# Patient Record
Sex: Female | Born: 1955 | Race: White | Hispanic: No | Marital: Single | Smoking: Current some day smoker
Health system: Southern US, Community
[De-identification: ages and names within clinical notes are randomized; demographics above are authoritative.]

## PROBLEM LIST (undated history)

## (undated) DIAGNOSIS — E079 Disorder of thyroid, unspecified: Secondary | ICD-10-CM

## (undated) DIAGNOSIS — J45909 Unspecified asthma, uncomplicated: Secondary | ICD-10-CM

## (undated) DIAGNOSIS — N390 Urinary tract infection, site not specified: Secondary | ICD-10-CM

## (undated) HISTORY — PX: ABDOMINAL HYSTERECTOMY: SHX81

## (undated) HISTORY — PX: NECK SURGERY: SHX720

## (undated) HISTORY — PX: OTHER SURGICAL HISTORY: SHX169

## (undated) HISTORY — PX: TONSILLECTOMY: SUR1361

## (undated) HISTORY — PX: BACK SURGERY: SHX140

---

## 2015-12-12 ENCOUNTER — Emergency Department (HOSPITAL_COMMUNITY): Payer: Medicare Other

## 2015-12-12 ENCOUNTER — Emergency Department (HOSPITAL_COMMUNITY)
Admission: EM | Admit: 2015-12-12 | Discharge: 2015-12-13 | Disposition: A | Discharge status: Home / Self Care | Payer: Medicare Other | Attending: Emergency Medicine | Admitting: Emergency Medicine

## 2015-12-12 ENCOUNTER — Encounter (HOSPITAL_COMMUNITY): Payer: Self-pay | Admitting: Emergency Medicine

## 2015-12-12 DIAGNOSIS — J45909 Unspecified asthma, uncomplicated: Secondary | ICD-10-CM | POA: Insufficient documentation

## 2015-12-12 DIAGNOSIS — F17200 Nicotine dependence, unspecified, uncomplicated: Secondary | ICD-10-CM | POA: Insufficient documentation

## 2015-12-12 DIAGNOSIS — Z79899 Other long term (current) drug therapy: Secondary | ICD-10-CM | POA: Diagnosis not present

## 2015-12-12 DIAGNOSIS — N390 Urinary tract infection, site not specified: Secondary | ICD-10-CM | POA: Diagnosis not present

## 2015-12-12 DIAGNOSIS — R1030 Lower abdominal pain, unspecified: Secondary | ICD-10-CM | POA: Diagnosis present

## 2015-12-12 HISTORY — DX: Urinary tract infection, site not specified: N39.0

## 2015-12-12 HISTORY — DX: Disorder of thyroid, unspecified: E07.9

## 2015-12-12 HISTORY — DX: Unspecified asthma, uncomplicated: J45.909

## 2015-12-12 LAB — BASIC METABOLIC PANEL
ANION GAP: 8 (ref 5–15)
BUN: 12 mg/dL (ref 6–20)
CHLORIDE: 103 mmol/L (ref 101–111)
CO2: 26 mmol/L (ref 22–32)
Calcium: 9.1 mg/dL (ref 8.9–10.3)
Creatinine, Ser: 0.58 mg/dL (ref 0.44–1.00)
Glucose, Bld: 103 mg/dL — ABNORMAL HIGH (ref 65–99)
POTASSIUM: 4.2 mmol/L (ref 3.5–5.1)
SODIUM: 137 mmol/L (ref 135–145)

## 2015-12-12 LAB — CBC
HEMATOCRIT: 40.2 % (ref 36.0–46.0)
Hemoglobin: 13.2 g/dL (ref 12.0–15.0)
MCH: 30.6 pg (ref 26.0–34.0)
MCHC: 32.8 g/dL (ref 30.0–36.0)
MCV: 93.1 fL (ref 78.0–100.0)
Platelets: 352 10*3/uL (ref 150–400)
RBC: 4.32 MIL/uL (ref 3.87–5.11)
RDW: 13.7 % (ref 11.5–15.5)
WBC: 7 10*3/uL (ref 4.0–10.5)

## 2015-12-12 LAB — URINE MICROSCOPIC-ADD ON

## 2015-12-12 LAB — URINALYSIS, ROUTINE W REFLEX MICROSCOPIC
Bilirubin Urine: NEGATIVE
Glucose, UA: NEGATIVE mg/dL
Hgb urine dipstick: NEGATIVE
Ketones, ur: NEGATIVE mg/dL
LEUKOCYTES UA: NEGATIVE
NITRITE: POSITIVE — AB
PROTEIN: NEGATIVE mg/dL
SPECIFIC GRAVITY, URINE: 1.017 (ref 1.005–1.030)
pH: 5.5 (ref 5.0–8.0)

## 2015-12-12 MED ORDER — MORPHINE SULFATE (PF) 4 MG/ML IV SOLN
4.0000 mg | Freq: Once | INTRAVENOUS | Status: AC
  Administered 2015-12-12: 4 mg via INTRAVENOUS
  Filled 2015-12-12: qty 1

## 2015-12-12 MED ORDER — ONDANSETRON 4 MG PO TBDP
4.0000 mg | ORAL_TABLET | Freq: Once | ORAL | Status: AC | PRN
  Administered 2015-12-12: 4 mg via ORAL
  Filled 2015-12-12: qty 1

## 2015-12-12 MED ORDER — DEXTROSE 5 % IV SOLN
1.0000 g | Freq: Once | INTRAVENOUS | Status: AC
  Administered 2015-12-12: 1 g via INTRAVENOUS
  Filled 2015-12-12: qty 10

## 2015-12-12 MED ORDER — SODIUM CHLORIDE 0.9 % IV BOLUS (SEPSIS)
500.0000 mL | Freq: Once | INTRAVENOUS | Status: AC
  Administered 2015-12-12: 500 mL via INTRAVENOUS

## 2015-12-12 MED ORDER — SULFAMETHOXAZOLE-TRIMETHOPRIM 800-160 MG PO TABS: 1.0000 | ORAL_TABLET | Freq: Two times a day (BID) | ORAL | 0 refills | Status: AC

## 2015-12-12 NOTE — Discharge Instructions (Signed)
Take antibiotics as prescribed. Encourage adequate hydration, drink plenty of fluids. Follow up with your primary care provider for re-evaluation. Return to the ED if you experience fevers, chills, vomiting, blood in urine.

## 2015-12-12 NOTE — ED Triage Notes (Addendum)
Pt complaint of positive UTI with E coli diagnosed via primary care physician. Pt currently taking nitrofurantion starting 7/26 without relief. Pt reports last percocet 2 hours ago.

## 2015-12-12 NOTE — ED Provider Notes (Signed)
[facility]-EMERGENCY DEPT Provider Note   CSN: [id number] Arrival date & time: 12/12/15  1717  First Provider Contact:  None       History   Chief Complaint Chief Complaint  Patient presents with  . Urinary Tract Infection  . Abnormal Lab    HPI Felicia Haynes is a 60 y.o. female with no significant past medical history who presents to the ED today complaining of dysuria and right-sided flank pain. Patient states that one week ago she began having symptoms of bladder pressure, hematuria and right-sided flank pain. She was seen by her PCP and was diagnosed with UTI from Escherichia coli. Patient was started on nitrofurantoin which she has been taking daily. She states that her pain is continuing and she has not had any relief of the antibiotics. She's been taking home Percocet which she is prescribed for chronic back pain without any relief of her pain. She denies any nausea, vomiting, fevers or chills.  HPI  Past Medical History:  Diagnosis Date  . Asthma   . Thyroid disease   . UTI (lower urinary tract infection)     There are no active problems to display for this patient.   Past Surgical History:  Procedure Laterality Date  . ABDOMINAL HYSTERECTOMY    . BACK SURGERY    . NECK SURGERY    . rotator cuff surgery    . TONSILLECTOMY      OB History    No data available       Home Medications    Prior to Admission medications   Medication Sig Start Date End Date Taking? Authorizing Provider  albuterol (PROVENTIL HFA;VENTOLIN HFA) 108 (90 Base) MCG/ACT inhaler Inhale 2 puffs into the lungs every 6 (six) hours as needed for wheezing or shortness of breath.   Yes Historical Provider, MD  alprazolam Prudy Feeler) 2 MG tablet Take 2 mg by mouth 3 (three) times daily as needed for sleep.  03/17/12  Yes Historical Provider, MD  estrogens, conjugated, (PREMARIN) 0.625 MG tablet TAKE (1) TABLET BY MOUTH DAILY (EVERY 24 HOURS). 11/07/15  Yes Historical Provider, MD  fluticasone  furoate-vilanterol (BREO ELLIPTA) 100-25 MCG/INH AEPB Inhale 1 puff into the lungs daily.  11/07/15 11/06/16 Yes Historical Provider, MD  levothyroxine (SYNTHROID, LEVOTHROID) 75 MCG tablet TAKE 1 TABLET BY MOUTH DAILY 06/30/15 06/29/16 Yes Historical Provider, MD  lidocaine (XYLOCAINE) 5 % ointment APPLY 2 GRAMS TOPICALLY TO THE AFFECTED AREA 2 TIMES PER DAY 11/28/15  Yes Historical Provider, MD  nitrofurantoin, macrocrystal-monohydrate, (MACROBID) 100 MG capsule Take 100 mg by mouth 2 (two) times daily. ABT Start Date 12/07/15 & End Date 12/15/15. 12/06/15 12/13/15 Yes Historical Provider, MD  Omega-3 Fatty Acids (FISH OIL) 1000 MG CAPS Take 1 capsule by mouth daily.    Yes Historical Provider, MD  oxyCODONE-acetaminophen (PERCOCET) 7.5-325 MG tablet Take 1 tablet by mouth every 6 (six) hours as needed for moderate pain.    Yes Historical Provider, MD  QUEtiapine (SEROQUEL) 300 MG tablet Take 300 mg by mouth at bedtime.  12/10/13  Yes Historical Provider, MD  valACYclovir (VALTREX) 1000 MG tablet Take 500 mg by mouth daily as needed (fever blister).  11/07/15  Yes Historical Provider, MD  Vitamin D, Ergocalciferol, (DRISDOL) 50000 units CAPS capsule Take 50,000 Units by mouth every 7 (seven) days.  11/07/15 11/06/16 Yes Historical Provider, MD    Family History No family history on file.  Social History Social History  Substance Use Topics  . Smoking status: Current Some Day  Smoker  . Smokeless tobacco: Not on file  . Alcohol use No     Allergies   Review of patient's allergies indicates no known allergies.   Review of Systems Review of Systems  All other systems reviewed and are negative.    Physical Exam Updated Vital Signs BP 151/87 (BP Location: Right Arm)   Pulse 98   Temp 98.2 F (36.8 C) (Oral)   Resp 18   LMP  (LMP Unknown)   SpO2 98%   Physical Exam  Constitutional: She is oriented to person, place, and time. She appears well-developed and well-nourished. No distress.  HENT:    Head: Normocephalic and atraumatic.  Mouth/Throat: No oropharyngeal exudate.  Eyes: Conjunctivae and EOM are normal. Pupils are equal, round, and reactive to light. Right eye exhibits no discharge. Left eye exhibits no discharge. No scleral icterus.  Cardiovascular: Normal rate, regular rhythm, normal heart sounds and intact distal pulses.  Exam reveals no gallop and no friction rub.   No murmur heard. Pulmonary/Chest: Effort normal and breath sounds normal. No respiratory distress. She has no wheezes. She has no rales. She exhibits no tenderness.  Abdominal: Soft. She exhibits no distension. There is tenderness ( suprapubic). There is no guarding.  Right CVA tenderness   Musculoskeletal: Normal range of motion. She exhibits no edema.  Neurological: She is alert and oriented to person, place, and time.  Skin: Skin is warm and dry. No rash noted. She is not diaphoretic. No erythema. No pallor.  Psychiatric: She has a normal mood and affect. Her behavior is normal.  Nursing note and vitals reviewed.    ED Treatments / Results  Labs (all labs ordered are listed, but only abnormal results are displayed) Labs Reviewed  URINALYSIS, ROUTINE W REFLEX MICROSCOPIC (NOT AT [facility]) - Abnormal; Notable for the following:       Result Value   Color, Urine AMBER (*)    APPearance CLOUDY (*)    Nitrite POSITIVE (*)    All other components within normal limits  BASIC METABOLIC PANEL - Abnormal; Notable for the following:    Glucose, Bld 103 (*)    All other components within normal limits  URINE MICROSCOPIC-ADD ON - Abnormal; Notable for the following:    Squamous Epithelial / LPF 6-30 (*)    Bacteria, UA RARE (*)    All other components within normal limits  URINE CULTURE  CBC    EKG  EKG Interpretation None       Radiology Ct Renal Stone Study  Result Date: 12/12/2015 CLINICAL DATA:  RIGHT flank pain, no history of kidney stones. Concurrent urinary tract infection, on antibiotics  without symptomatic improvement. EXAM: CT ABDOMEN AND PELVIS WITHOUT CONTRAST TECHNIQUE: Multidetector CT imaging of the abdomen and pelvis was performed following the standard protocol without IV contrast. COMPARISON:  None. FINDINGS: LUNG BASES: Included view of the lung bases are clear. The visualized heart and pericardium are unremarkable. KIDNEYS/BLADDER: Kidneys are orthotopic, demonstrating normal size and morphology. No nephrolithiasis, hydronephrosis; limited assessment for renal masses on this nonenhanced examination. Mild pelviectasis without frank hydronephrosis. The unopacified ureters are normal in course and caliber. Urinary bladder is decompressed and unremarkable. SOLID ORGANS: The spleen, gallbladder, and adrenal glands are unremarkable for this non-contrast examination. Punctate calcified splenic granulomas. Fatty atrophy of the pancreas. GASTROINTESTINAL TRACT: The stomach, small and large bowel are normal in course and caliber without inflammatory changes, the sensitivity may be decreased by lack of enteric contrast. Mild colonic diverticulosis. Normal appendix.  PERITONEUM/RETROPERITONEUM: Aortoiliac vessels are normal in course and caliber, mild calcific atherosclerosis. No lymphadenopathy by CT size criteria. Status post hysterectomy. No intraperitoneal free fluid nor free air. SOFT TISSUES/ OSSEOUS STRUCTURES: Nonsuspicious. Status post L5-S1 PLIF, solid interbody fusion. IMPRESSION: No urolithiasis or obstructive uropathy. No acute intra-abdominal/pelvic process by noncontrast CT. Electronically Signed   By: Awilda Metro M.D.   On: 12/12/2015 22:21    Procedures Procedures (including critical care time)  Medications Ordered in ED Medications  ondansetron (ZOFRAN-ODT) disintegrating tablet 4 mg (4 mg Oral Given 12/12/15 1817)  sodium chloride 0.9 % bolus 500 mL (500 mLs Intravenous New Bag/Given 12/12/15 2237)  cefTRIAXone (ROCEPHIN) 1 g in dextrose 5 % 50 mL IVPB (1 g  Intravenous New Bag/Given 12/12/15 2228)  morphine 4 MG/ML injection 4 mg (4 mg Intravenous Given 12/12/15 2227)     Initial Impression / Assessment and Plan / ED Course  I have reviewed the triage vital signs and the nursing notes.  Pertinent labs & imaging results that were available during my care of the patient were reviewed by me and considered in my medical decision making (see chart for details).  Clinical Course    60 y.o F presents to the ED c/o ongoing bladder press and R flank pain anf ter being diagnosed with a UTI 1 week ago by PCP. Pt has been taking nitrofurantoin without relief. UA is Nitrite positive and appears infected. HGB in urine. Pt otherwise appears well, non-toxic and non-septic appearing. All VSS. Pt given IV rocephin, fluids and pain medication. CT renal stone study obtained which was unremarkable. No leukocytosis. Renal function is wnl. Feel that pt can be discharge home with rx change to Bactrim. Follow up with PCP next week.  Final Clinical Impressions(s) / ED Diagnoses   Final diagnoses:  UTI (lower urinary tract infection)    New Prescriptions New Prescriptions   No medications on file     Dub Mikes, PA-C 12/13/15 1343    Maia Plan, MD 12/13/15 1504

## 2015-12-14 LAB — URINE CULTURE: Culture: NO GROWTH

## 2016-04-17 ENCOUNTER — Ambulatory Visit: Payer: Medicare Other | Admitting: Internal Medicine

## 2016-06-15 ENCOUNTER — Ambulatory Visit: Payer: Medicare Other | Admitting: Internal Medicine

## 2017-01-29 IMAGING — CT CT RENAL STONE PROTOCOL
2 of 3 series · 16 of 46 positions shown, 18 images · non-contrast
Comparison: None.

CLINICAL DATA: RIGHT flank pain, no history of kidney stones.
Concurrent urinary tract infection, on antibiotics without
symptomatic improvement.

EXAM:
CT ABDOMEN AND PELVIS WITHOUT CONTRAST
TECHNIQUE: Multidetector CT imaging of the abdomen and pelvis was performed
following the standard protocol without IV contrast.

[Series 4: lung · axial · 0.71mm/px · z∈[-118,-38]mm · 13 of 48 slices shown, 15 images]
[im 4/48  soft-tissue]
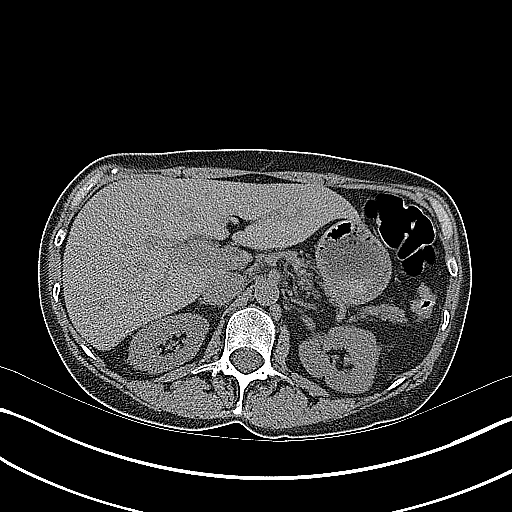
[im 4/48  bone]
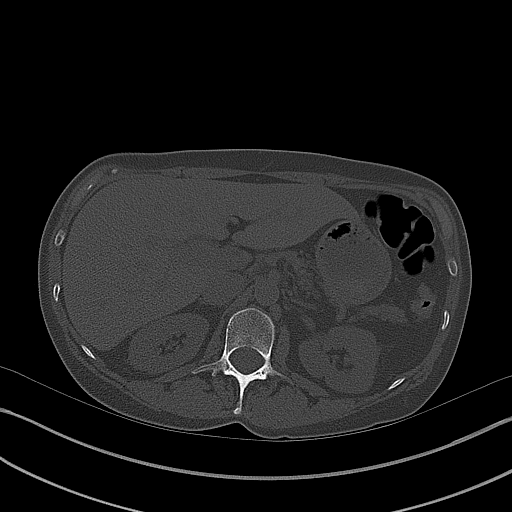
[im 7/48  soft-tissue]
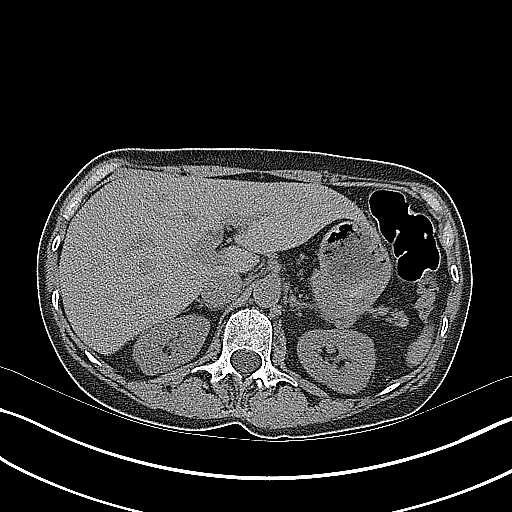
[im 10/48  soft-tissue]
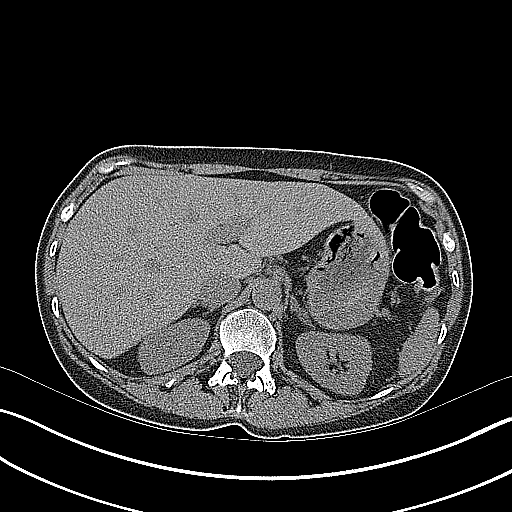
[im 14/48  soft-tissue]
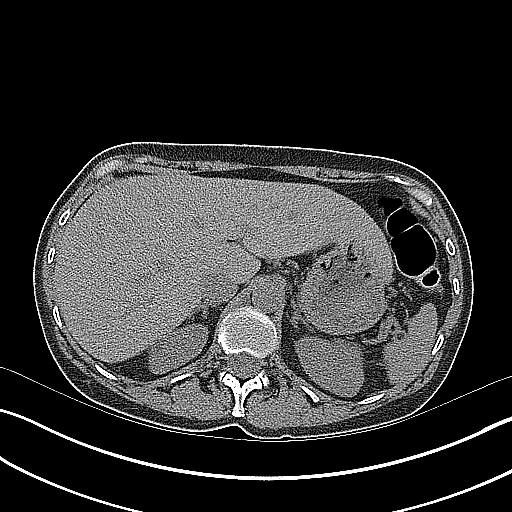
[im 17/48  soft-tissue]
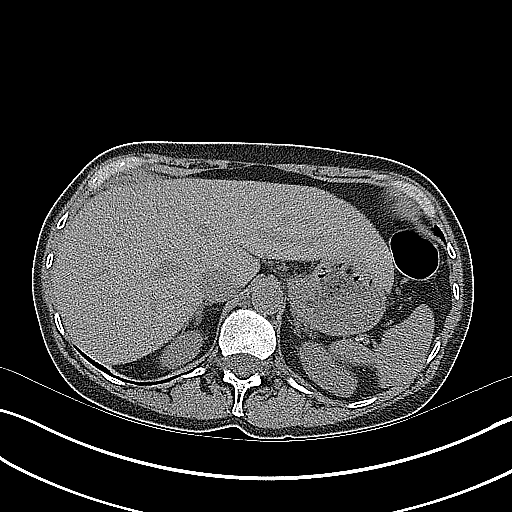
[im 20/48  soft-tissue]
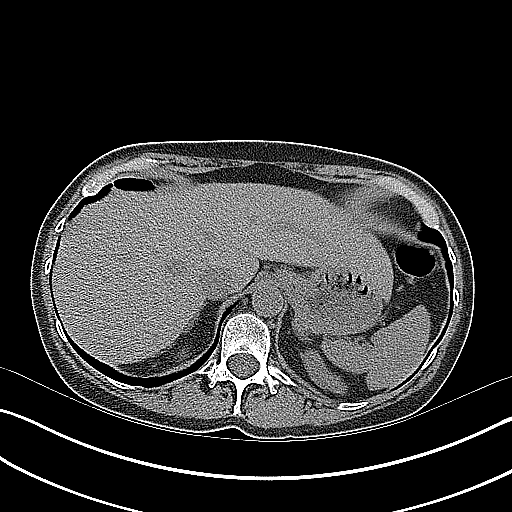
[im 25/48  soft-tissue]
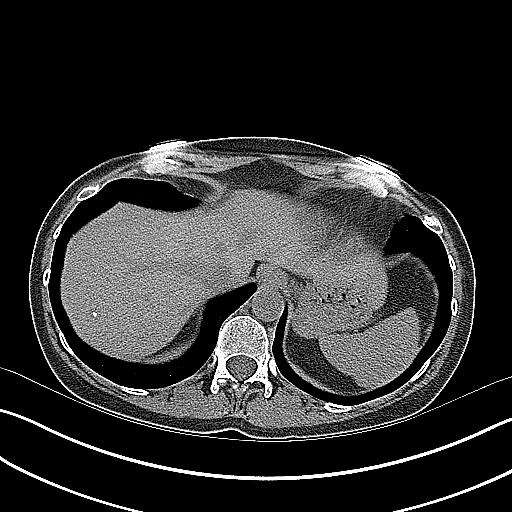
[im 28/48  soft-tissue]
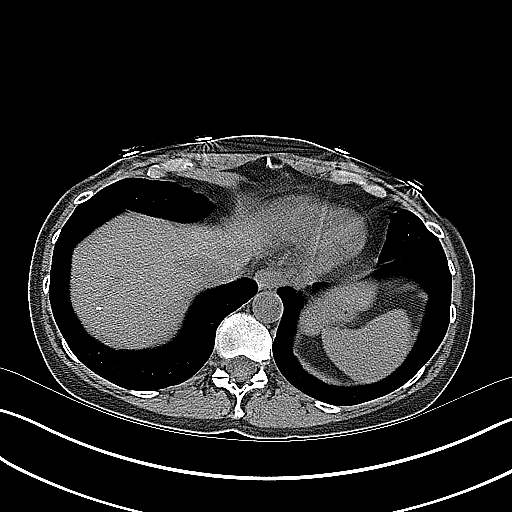
[im 31/48  soft-tissue]
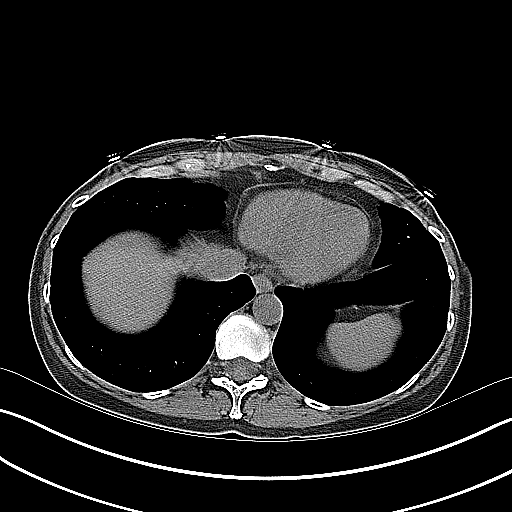
[im 31/48  bone]
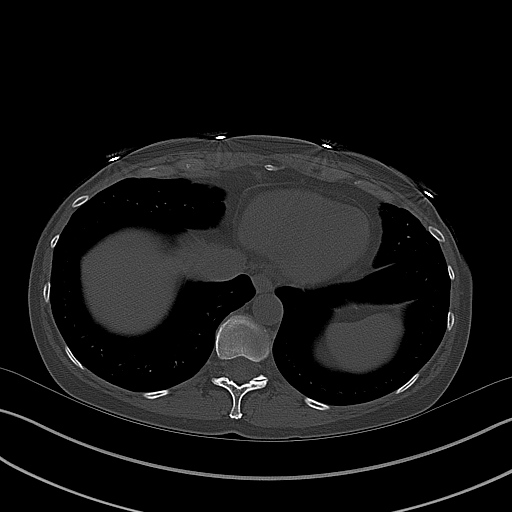
[im 34/48  soft-tissue]
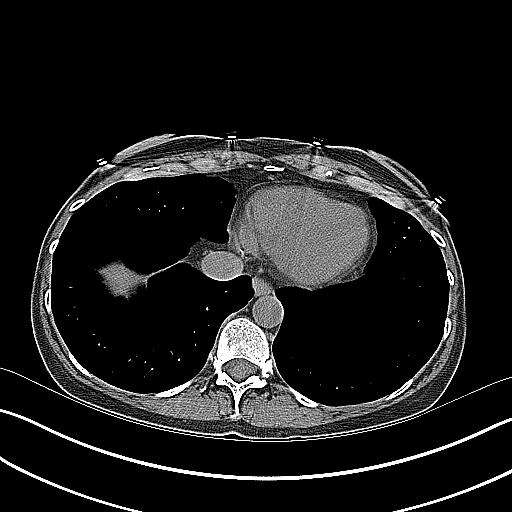
[im 38/48  soft-tissue]
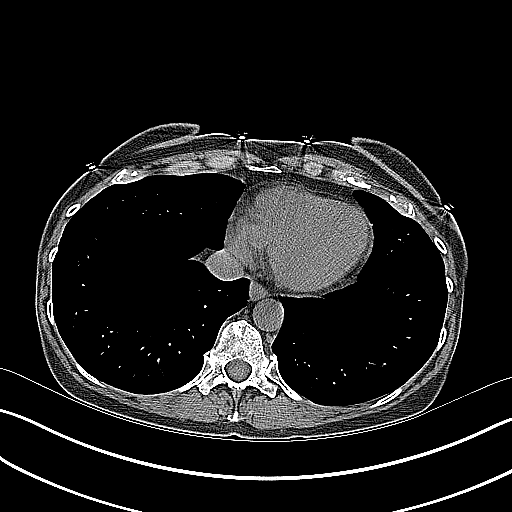
[im 41/48  soft-tissue]
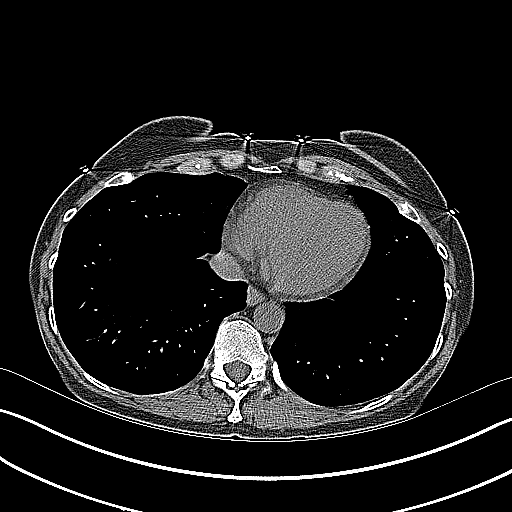
[im 44/48  soft-tissue]
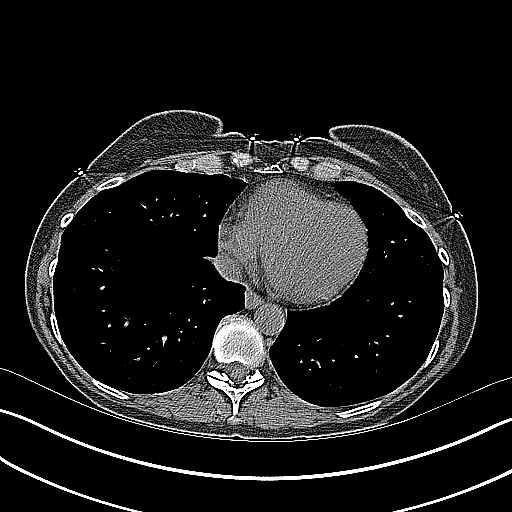

[Series 5: coronal · coronal · 0.77mm/px · 3 of 134 slices shown]
[im 45/134  soft-tissue]
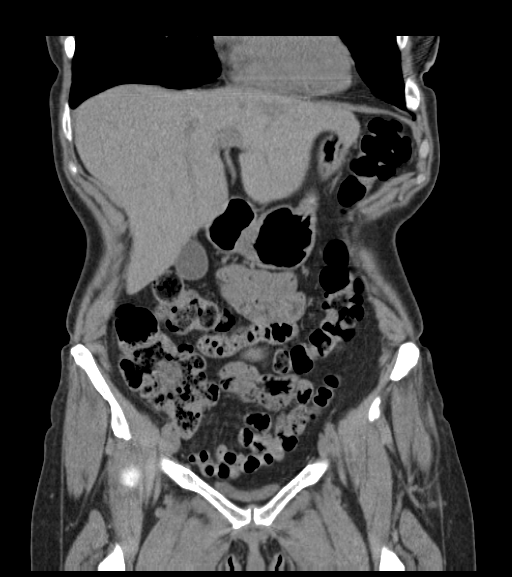
[im 60/134  soft-tissue]
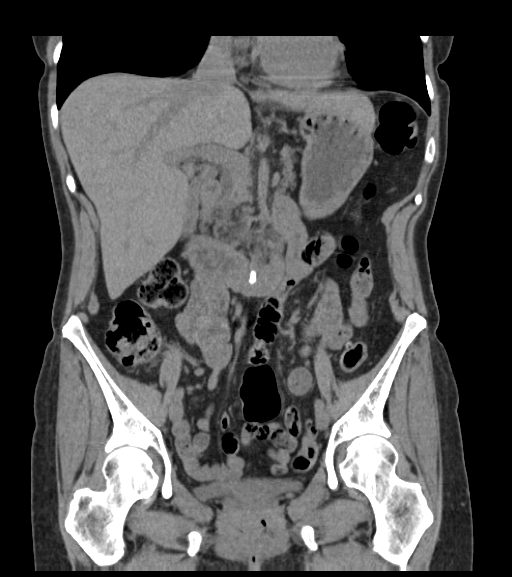
[im 74/134  soft-tissue]
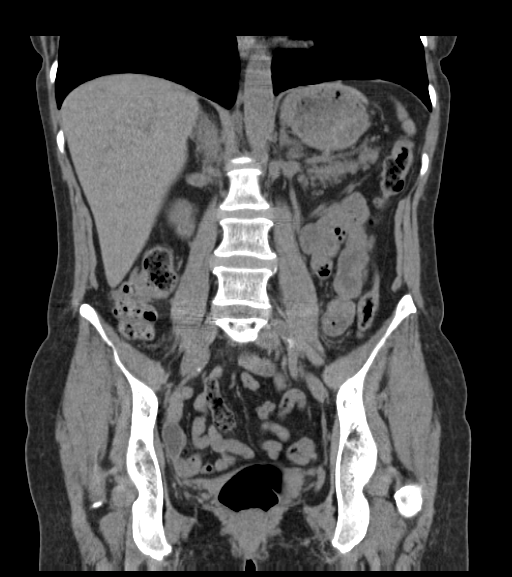

[16 of 46 positions shown; findings below may reference images not displayed]

FINDINGS: LUNG BASES: Included view of the lung bases are clear. The
visualized heart and pericardium are unremarkable.

KIDNEYS/BLADDER: Kidneys are orthotopic, demonstrating normal size
and morphology. No nephrolithiasis, hydronephrosis; limited
assessment for renal masses on this nonenhanced examination. Mild
pelviectasis without frank hydronephrosis. The unopacified ureters
are normal in course and caliber. Urinary bladder is decompressed
and unremarkable.

SOLID ORGANS: The spleen, gallbladder, and adrenal glands are
unremarkable for this non-contrast examination. Punctate calcified
splenic granulomas. Fatty atrophy of the pancreas.

GASTROINTESTINAL TRACT: The stomach, small and large bowel are
normal in course and caliber without inflammatory changes, the
sensitivity may be decreased by lack of enteric contrast. Mild
colonic diverticulosis. Normal appendix.

PERITONEUM/RETROPERITONEUM: Aortoiliac vessels are normal in course
and caliber, mild calcific atherosclerosis. No lymphadenopathy by CT
size criteria. Status post hysterectomy. No intraperitoneal free
fluid nor free air.

SOFT TISSUES/ OSSEOUS STRUCTURES: Nonsuspicious. Status post L5-S1
PLIF, solid interbody fusion.
IMPRESSION: No urolithiasis or obstructive uropathy. No acute
intra-abdominal/pelvic process by noncontrast CT.
